# Patient Record
Sex: Male | Born: 1970 | Race: White | Hispanic: No | Marital: Single | Smoking: Current every day smoker
Health system: Southern US, Community
[De-identification: ages and names within clinical notes are randomized; demographics above are authoritative.]

## PROBLEM LIST (undated history)

## (undated) HISTORY — PX: COLON SURGERY: SHX602

## (undated) HISTORY — PX: HERNIA REPAIR: SHX51

## (undated) HISTORY — PX: ABDOMINAL SURGERY: SHX537

---

## 2001-07-30 ENCOUNTER — Encounter: Payer: Self-pay | Admitting: *Deleted

## 2001-07-30 ENCOUNTER — Emergency Department (HOSPITAL_COMMUNITY): Admission: EM | Admit: 2001-07-30 | Discharge: 2001-07-30 | Payer: Self-pay | Admitting: *Deleted

## 2004-09-07 ENCOUNTER — Inpatient Hospital Stay (HOSPITAL_COMMUNITY): Admission: EM | Admit: 2004-09-07 | Discharge: 2004-09-13 | Payer: Self-pay | Admitting: Psychiatry

## 2004-09-07 ENCOUNTER — Ambulatory Visit: Payer: Self-pay | Admitting: Psychiatry

## 2004-09-07 ENCOUNTER — Emergency Department (HOSPITAL_COMMUNITY): Admission: EM | Admit: 2004-09-07 | Discharge: 2004-09-07 | Payer: Self-pay | Admitting: Emergency Medicine

## 2010-11-26 ENCOUNTER — Emergency Department (HOSPITAL_COMMUNITY)
Admission: EM | Admit: 2010-11-26 | Discharge: 2010-11-26 | Disposition: A | Discharge status: Home / Self Care | Payer: Self-pay | Attending: Emergency Medicine | Admitting: Emergency Medicine

## 2010-11-26 DIAGNOSIS — H60399 Other infective otitis externa, unspecified ear: Secondary | ICD-10-CM | POA: Insufficient documentation

## 2012-01-21 ENCOUNTER — Encounter (HOSPITAL_COMMUNITY): Payer: Self-pay | Admitting: *Deleted

## 2012-01-21 ENCOUNTER — Emergency Department (HOSPITAL_COMMUNITY)
Admission: EM | Admit: 2012-01-21 | Discharge: 2012-01-21 | Disposition: A | Discharge status: Home / Self Care | Payer: Self-pay | Attending: Emergency Medicine | Admitting: Emergency Medicine

## 2012-01-21 DIAGNOSIS — L0291 Cutaneous abscess, unspecified: Secondary | ICD-10-CM

## 2012-01-21 DIAGNOSIS — F17200 Nicotine dependence, unspecified, uncomplicated: Secondary | ICD-10-CM | POA: Insufficient documentation

## 2012-01-21 DIAGNOSIS — L0211 Cutaneous abscess of neck: Secondary | ICD-10-CM | POA: Insufficient documentation

## 2012-01-21 MED ORDER — HYDROCODONE-ACETAMINOPHEN 5-325 MG PO TABS: 1.0000 | ORAL_TABLET | ORAL | Status: DC | PRN

## 2012-01-21 MED ORDER — SULFAMETHOXAZOLE-TRIMETHOPRIM 800-160 MG PO TABS: 1.0000 | ORAL_TABLET | Freq: Two times a day (BID) | ORAL | Status: DC

## 2012-01-21 MED ORDER — SULFAMETHOXAZOLE-TRIMETHOPRIM 800-160 MG PO TABS: 1.0000 | ORAL_TABLET | Freq: Two times a day (BID) | ORAL | Status: AC

## 2012-01-21 MED ORDER — LIDOCAINE-EPINEPHRINE 2 %-1:100000 IJ SOLN: 1.7000 mL | Freq: Once | INTRAMUSCULAR | Status: DC

## 2012-01-21 MED ORDER — OXYCODONE-ACETAMINOPHEN 5-325 MG PO TABS: 1.0000 | ORAL_TABLET | ORAL | Status: AC | PRN

## 2012-01-21 MED ORDER — LIDOCAINE-EPINEPHRINE (PF) 2 %-1:200000 IJ SOLN
INTRAMUSCULAR | Status: AC
  Administered 2012-01-21: 20 mL
  Filled 2012-01-21: qty 20

## 2012-01-21 NOTE — ED Notes (Signed)
Pt has swollen reddened area to the right side of his neck for at least a week. Alert and oriented x 3. Skin warm and dry. Color pink. No acute distress. No drainage noted.

## 2012-01-21 NOTE — ED Notes (Signed)
Pt c/o ? Insect bite to the right side of his neck at least a week ago. Swollen and painful with no drainage noted.

## 2012-01-22 NOTE — ED Provider Notes (Signed)
History     CSN: [id number]  Arrival date & time 01/21/12  1032   First MD Initiated Contact with Patient 01/21/12 1039      Chief Complaint  Patient presents with  . Wound Check    (Consider location/radiation/quality/duration/timing/severity/associated sxs/prior treatment) HPI Comments: Jacob Galloway presents with a red, swollen, raised abscess on his right neck, present for the past week.  He squeezed it 2 days ago,  With a scant amount of pus obtained,  But none since. The site is tender.  He denies fevers and chills and has no pharyngitis.  Pain radiates to lateral jawline.  The history is provided by the patient.    History reviewed. No pertinent past medical history.  Past Surgical History  Procedure Date  . Abdominal surgery     History reviewed. No pertinent family history.  History  Substance Use Topics  . Smoking status: Current Everyday Smoker -- 1.0 packs/day    Types: Cigarettes  . Smokeless tobacco: Not on file  . Alcohol Use: Yes      Review of Systems  Constitutional: Negative for fever and chills.  HENT: Negative for facial swelling.   Respiratory: Negative for shortness of breath and wheezing.   Skin: Positive for wound.  Neurological: Negative for numbness.    Allergies  Codeine and Phenobarbital  Home Medications   Current Outpatient Rx  Name Route Sig Dispense Refill  . OXYCODONE-ACETAMINOPHEN 5-325 MG PO TABS Oral Take 1 tablet by mouth every 4 (four) hours as needed for pain. 20 tablet 0  . SULFAMETHOXAZOLE-TRIMETHOPRIM 800-160 MG PO TABS Oral Take 1 tablet by mouth 2 (two) times daily. 20 tablet 0    BP 137/91  Pulse 86  Temp 98 F (36.7 C) (Oral)  Resp 16  Ht 6\' 2"  (1.88 m)  Wt 180 lb (81.647 kg)  BMI 23.11 kg/m2  SpO2 99%  Physical Exam  Constitutional: He is oriented to person, place, and time. He appears well-developed and well-nourished.  HENT:  Head: Normocephalic.  Cardiovascular: Normal rate.     Pulmonary/Chest: Effort normal.  Neurological: He is alert and oriented to person, place, and time. No sensory deficit.  Skin: Skin is warm and dry.       1 cm raised nodule, indurated right lateral neck.  Central non draining punctum.  No red streaking or surrounding erythema.  No cervical or submandibular adenopathy.    ED Course  Procedures (including critical care time)  Labs Reviewed - No data to display No results found.   1. Abscess     INCISION AND DRAINAGE Performed by: Burgess Amor Consent: Verbal consent obtained. Risks and benefits: risks, benefits and alternatives were discussed Type: abscess  Body area: right neck  Anesthesia: local infiltration  Local anesthetic: lidocaine 2% with epinephrine  Anesthetic total: 1 ml  Complexity: complex Blunt dissection to break up loculations  Drainage: purulent  Drainage amount: small  Packing material: abscess not large enough for packing  Patient tolerance: Patient tolerated the procedure well with no immediate complications.  2 x 2 dressing.   MDM  Bactrim,  Oxycodone,  Warm soaks.  PRN f/u        Burgess Amor, [place] 01/22/12 2142

## 2012-01-25 NOTE — ED Provider Notes (Signed)
Medical screening examination/treatment/procedure(s) were performed by non-physician practitioner and as supervising physician I was immediately available for consultation/collaboration.   Cristobal Advani W Diane Hanel, MD 01/25/12 0855 

## 2012-04-01 ENCOUNTER — Emergency Department (HOSPITAL_COMMUNITY)
Admission: EM | Admit: 2012-04-01 | Discharge: 2012-04-01 | Disposition: A | Discharge status: Home / Self Care | Payer: Self-pay | Attending: Emergency Medicine | Admitting: Emergency Medicine

## 2012-04-01 ENCOUNTER — Emergency Department (HOSPITAL_COMMUNITY): Payer: Self-pay

## 2012-04-01 ENCOUNTER — Encounter (HOSPITAL_COMMUNITY): Payer: Self-pay | Admitting: *Deleted

## 2012-04-01 DIAGNOSIS — M7989 Other specified soft tissue disorders: Secondary | ICD-10-CM | POA: Insufficient documentation

## 2012-04-01 DIAGNOSIS — S63509A Unspecified sprain of unspecified wrist, initial encounter: Secondary | ICD-10-CM | POA: Insufficient documentation

## 2012-04-01 DIAGNOSIS — M25539 Pain in unspecified wrist: Secondary | ICD-10-CM | POA: Insufficient documentation

## 2012-04-01 DIAGNOSIS — R296 Repeated falls: Secondary | ICD-10-CM | POA: Insufficient documentation

## 2012-04-01 DIAGNOSIS — F17200 Nicotine dependence, unspecified, uncomplicated: Secondary | ICD-10-CM | POA: Insufficient documentation

## 2012-04-01 MED ORDER — OXYCODONE-ACETAMINOPHEN 5-325 MG PO TABS
1.0000 | ORAL_TABLET | Freq: Once | ORAL | Status: AC
  Administered 2012-04-01: 1 via ORAL
  Filled 2012-04-01: qty 1

## 2012-04-01 MED ORDER — OXYCODONE-ACETAMINOPHEN 5-325 MG PO TABS: 1.0000 | ORAL_TABLET | ORAL | Status: AC | PRN

## 2012-04-01 MED ORDER — NAPROXEN 500 MG PO TABS: 500.0000 mg | ORAL_TABLET | Freq: Two times a day (BID) | ORAL | Status: DC

## 2012-04-01 NOTE — ED Notes (Signed)
Pt c/o fall landing on right wrist three days ago, pain to right wrist area, cms intact distal

## 2012-04-01 NOTE — ED Provider Notes (Signed)
History     CSN: [id number]  Arrival date & time 04/01/12  1305   First MD Initiated Contact with Patient 04/01/12 1337      Chief Complaint  Patient presents with  . Wrist Pain    (Consider location/radiation/quality/duration/timing/severity/associated sxs/prior treatment) HPI Comments: Patient complains of right wrist pain and swelling for 3 days. He states pain began after falling on an outstretched hand. Pain is worse with movement, improves with rest.  He denies pain to the right elbow or shoulder.  Patient is a 41 y.o. male presenting with wrist pain. The history is provided by the patient.  Wrist Pain This is a new problem. The current episode started in the past 7 days. The problem occurs constantly. The problem has been unchanged. Associated symptoms include arthralgias and joint swelling. Pertinent negatives include no chest pain, chills, congestion, coughing, fever, headaches, myalgias, nausea, neck pain, numbness, rash, visual change, vomiting or weakness. The symptoms are aggravated by bending (Movement and palpation). He has tried NSAIDs for the symptoms. The treatment provided mild relief.    History reviewed. No pertinent past medical history.  Past Surgical History  Procedure Date  . Abdominal surgery     No family history on file.  History  Substance Use Topics  . Smoking status: Current Every Day Smoker -- 1.0 packs/day    Types: Cigarettes  . Smokeless tobacco: Not on file  . Alcohol Use: Yes      Review of Systems  Constitutional: Negative for fever and chills.  HENT: Negative for congestion and neck pain.   Respiratory: Negative for cough.   Cardiovascular: Negative for chest pain.  Gastrointestinal: Negative for nausea and vomiting.  Genitourinary: Negative for dysuria and difficulty urinating.  Musculoskeletal: Positive for joint swelling and arthralgias. Negative for myalgias and back pain.  Skin: Negative for color change, rash and wound.    Neurological: Negative for weakness, numbness and headaches.  All other systems reviewed and are negative.    Allergies  Codeine and Phenobarbital  Home Medications   Current Outpatient Rx  Name Route Sig Dispense Refill  . IBUPROFEN 600 MG PO TABS Oral Take 600 mg by mouth every 6 (six) hours as needed. Pain.      BP 144/87  Pulse 74  Temp 98.1 F (36.7 C) (Oral)  Resp 20  Ht 6\' 2"  (1.88 m)  Wt 180 lb (81.647 kg)  BMI 23.11 kg/m2  SpO2 100%  Physical Exam  Nursing note and vitals reviewed. Constitutional: He is oriented to person, place, and time. He appears well-developed and well-nourished. No distress.  HENT:  Head: Normocephalic and atraumatic.  Cardiovascular: Normal rate, regular rhythm and normal heart sounds.   Pulmonary/Chest: Effort normal and breath sounds normal.  Musculoskeletal: He exhibits edema and tenderness.       Right wrist: He exhibits decreased range of motion, tenderness, bony tenderness and swelling. He exhibits no effusion, no crepitus, no deformity and no laceration.       Right wrist is ttp.  Mild to moderate soft tissue swelling is present.  Radial pulse is brisk, distal sensation intact.  CR< 2 sec.  No bruising or deformity.    Neurological: He is alert and oriented to person, place, and time. He exhibits normal muscle tone. Coordination normal.  Skin: Skin is warm and dry.    ED Course  Procedures (including critical care time)  Labs Reviewed - No data to display Dg Wrist Complete Right  04/01/2012  *RADIOLOGY REPORT*  Clinical Data: Wrist pain, trauma 1 month ago and today  RIGHT WRIST - COMPLETE 3+ VIEW  Comparison: None.  Findings: No fracture or dislocation.  No soft tissue abnormality. No radiopaque foreign body.  IMPRESSION: Normal exam.   Original Report Authenticated By: Harrel Lemon, M.D.       velcro splint applied, pain improved, remains NV intact  MDM   Consulted Dr. Hilda Lias. Will see pt in his office.   Recommended velcro wrist splint.    Patient agrees to elevate and apply ice to his wrist.    Prescribed: Naproxen Percocet #20   Alexzia Kasler L. [place], [place] 04/02/12 1824

## 2012-04-03 NOTE — ED Provider Notes (Signed)
Medical screening examination/treatment/procedure(s) were performed by non-physician practitioner and as supervising physician I was immediately available for consultation/collaboration.   Gwyneth Sprout, MD 04/03/12 [phone]

## 2012-09-08 ENCOUNTER — Emergency Department (HOSPITAL_COMMUNITY): Payer: Self-pay

## 2012-09-08 ENCOUNTER — Encounter (HOSPITAL_COMMUNITY): Payer: Self-pay | Admitting: *Deleted

## 2012-09-08 ENCOUNTER — Emergency Department (HOSPITAL_COMMUNITY)
Admission: EM | Admit: 2012-09-08 | Discharge: 2012-09-08 | Disposition: A | Discharge status: Home / Self Care | Payer: Self-pay | Attending: Emergency Medicine | Admitting: Emergency Medicine

## 2012-09-08 DIAGNOSIS — S99919A Unspecified injury of unspecified ankle, initial encounter: Secondary | ICD-10-CM | POA: Insufficient documentation

## 2012-09-08 DIAGNOSIS — M25561 Pain in right knee: Secondary | ICD-10-CM

## 2012-09-08 DIAGNOSIS — Y9289 Other specified places as the place of occurrence of the external cause: Secondary | ICD-10-CM | POA: Insufficient documentation

## 2012-09-08 DIAGNOSIS — W108XXA Fall (on) (from) other stairs and steps, initial encounter: Secondary | ICD-10-CM | POA: Insufficient documentation

## 2012-09-08 DIAGNOSIS — S8990XA Unspecified injury of unspecified lower leg, initial encounter: Secondary | ICD-10-CM | POA: Insufficient documentation

## 2012-09-08 DIAGNOSIS — Y9389 Activity, other specified: Secondary | ICD-10-CM | POA: Insufficient documentation

## 2012-09-08 DIAGNOSIS — F17200 Nicotine dependence, unspecified, uncomplicated: Secondary | ICD-10-CM | POA: Insufficient documentation

## 2012-09-08 MED ORDER — OXYCODONE-ACETAMINOPHEN 5-325 MG PO TABS: 1.0000 | ORAL_TABLET | ORAL | Status: DC | PRN

## 2012-09-08 MED ORDER — NAPROXEN 500 MG PO TABS: 500.0000 mg | ORAL_TABLET | Freq: Two times a day (BID) | ORAL | Status: DC

## 2012-09-08 MED ORDER — OXYCODONE-ACETAMINOPHEN 5-325 MG PO TABS
2.0000 | ORAL_TABLET | Freq: Once | ORAL | Status: AC
  Administered 2012-09-08: 2 via ORAL
  Filled 2012-09-08: qty 2

## 2012-09-08 NOTE — ED Provider Notes (Signed)
History     CSN: [id number]  Arrival date & time 09/08/12  1143   First MD Initiated Contact with Patient 09/08/12 1336      Chief Complaint  Patient presents with  . Knee Pain    (Consider location/radiation/quality/duration/timing/severity/associated sxs/prior treatment) Patient is a 42 y.o. male presenting with knee pain. The history is provided by the patient.  Knee Pain Location:  Knee Time since incident:  1 week Injury: yes   Mechanism of injury: fall   Fall:    Fall occurred:  Down stairs   Height of fall:  From a standing position   Impact surface:  Concrete   Point of impact:  Knees   Entrapped after fall: no   Knee location:  R knee Pain details:    Quality:  Aching   Radiates to:  Does not radiate   Severity:  Severe   Onset quality:  Sudden   Timing:  Constant   Progression:  Worsening Chronicity:  Recurrent Dislocation: no   Foreign body present:  No foreign bodies Relieved by:  Nothing Worsened by:  Bearing weight and flexion Ineffective treatments:  NSAIDs Associated symptoms: decreased ROM and swelling   Associated symptoms: no back pain, no fever, no neck pain, no numbness, no stiffness and no tingling     History reviewed. No pertinent past medical history.  Past Surgical History  Procedure Laterality Date  . Abdominal surgery      No family history on file.  History  Substance Use Topics  . Smoking status: Current Every Day Smoker -- 1.00 packs/day    Types: Cigarettes  . Smokeless tobacco: Not on file  . Alcohol Use: Yes      Review of Systems  Constitutional: Negative for fever and chills.  HENT: Negative for neck pain.   Genitourinary: Negative for dysuria and difficulty urinating.  Musculoskeletal: Positive for joint swelling and arthralgias. Negative for back pain and stiffness.  Skin: Negative for color change and wound.  Neurological: Negative for weakness, numbness and headaches.  All other systems reviewed and are  negative.    Allergies  Codeine and Phenobarbital  Home Medications  No current outpatient prescriptions on file.  BP 128/90  Pulse 98  Temp(Src) 98 F (36.7 C)  Resp 18  Ht 6\' 2"  (1.88 m)  Wt 170 lb (77.111 kg)  BMI 21.82 kg/m2  SpO2 99%  Physical Exam  Nursing note and vitals reviewed. Constitutional: He is oriented to person, place, and time. He appears well-developed and well-nourished. No distress.  Cardiovascular: Normal rate, regular rhythm, normal heart sounds and intact distal pulses.   Pulmonary/Chest: Effort normal and breath sounds normal.  Musculoskeletal: He exhibits edema and tenderness.  ttp of the right anterior knee.  No erythema, bruising or step-off deformity.  DP pulse is brisk, distal sensation intact  Neurological: He is alert and oriented to person, place, and time. He exhibits normal muscle tone. Coordination normal.  Skin: Skin is warm and dry. No erythema.    ED Course  Procedures (including critical care time)  Labs Reviewed - No data to display Dg Knee Complete 4 Views Right  09/08/2012  *RADIOLOGY REPORT*  Clinical Data: Right knee pain  RIGHT KNEE - COMPLETE 4+ VIEW  Comparison: None.  Findings:   No fracture or dislocation.  Small suprapatellar joint effusion is present.  IMPRESSION: No fracture.  Small effusion.   Original Report Authenticated By: Genevive Bi, M.D.      Knee immobilizer applied crutches  given, pain improved, remains neurovascularly intact.  MDM      2:08 PM consulted Dr. Hilda Lias, will see in his office tomorrow for f/u.    Patient agrees to our RICE therapy and orthopedic followup.  Prescribed: Naproxen Percocet #20   The patient appears reasonably screened and/or stabilized for discharge and I doubt any other medical condition or other [facility] requiring further screening, evaluation, or treatment in the ED at this time prior to discharge.     Malachi Suderman L. Daesia Zylka, PA-C 09/10/12 1700

## 2012-09-08 NOTE — ED Notes (Signed)
Tammy PA at bedside,  

## 2012-09-08 NOTE — ED Notes (Signed)
Pt states that he fell down stairs last week and hit right knee on center blocks. Had previous injury to same knee a month ago.

## 2012-09-11 NOTE — ED Provider Notes (Signed)
History/physical exam/procedure(s) were performed by non-physician practitioner and as supervising physician I was immediately available for consultation/collaboration. I have reviewed all notes and am in agreement with care and plan.   Moon Budde S Hermes Wafer, MD 09/11/12 1100 

## 2016-07-02 ENCOUNTER — Emergency Department (HOSPITAL_COMMUNITY): Payer: Self-pay

## 2016-07-02 ENCOUNTER — Emergency Department (HOSPITAL_COMMUNITY)
Admission: EM | Admit: 2016-07-02 | Discharge: 2016-07-02 | Disposition: A | Discharge status: Home / Self Care | Payer: Self-pay | Attending: Emergency Medicine | Admitting: Emergency Medicine

## 2016-07-02 ENCOUNTER — Encounter (HOSPITAL_COMMUNITY): Payer: Self-pay | Admitting: Emergency Medicine

## 2016-07-02 DIAGNOSIS — S63501A Unspecified sprain of right wrist, initial encounter: Secondary | ICD-10-CM | POA: Insufficient documentation

## 2016-07-02 DIAGNOSIS — Y999 Unspecified external cause status: Secondary | ICD-10-CM | POA: Insufficient documentation

## 2016-07-02 DIAGNOSIS — Y9351 Activity, roller skating (inline) and skateboarding: Secondary | ICD-10-CM | POA: Insufficient documentation

## 2016-07-02 DIAGNOSIS — F17210 Nicotine dependence, cigarettes, uncomplicated: Secondary | ICD-10-CM | POA: Insufficient documentation

## 2016-07-02 DIAGNOSIS — Y92331 Roller skating rink as the place of occurrence of the external cause: Secondary | ICD-10-CM | POA: Insufficient documentation

## 2016-07-02 MED ORDER — HYDROCODONE-ACETAMINOPHEN 5-325 MG PO TABS: 1.0000 | ORAL_TABLET | ORAL | 0 refills | Status: DC | PRN

## 2016-07-02 MED ORDER — HYDROCODONE-ACETAMINOPHEN 5-325 MG PO TABS
1.0000 | ORAL_TABLET | Freq: Once | ORAL | Status: AC
  Administered 2016-07-02: 1 via ORAL
  Filled 2016-07-02: qty 1

## 2016-07-02 MED ORDER — IBUPROFEN 600 MG PO TABS: 600.0000 mg | ORAL_TABLET | Freq: Four times a day (QID) | ORAL | 0 refills | Status: DC | PRN

## 2016-07-02 NOTE — ED Provider Notes (Signed)
[facility]-EMERGENCY DEPT Provider Note   CSN: [id number] Arrival date & time: 07/02/16  0023     History   Chief Complaint Chief Complaint  Patient presents with  . 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  He originally felt he was just bruised, so went home, but has been unable to sleep secondary to throbbing pain.  He denies radiation of pain. It is worsened with palpation and range of motion.  He has had no treatment prior arrival.  The history is provided by the patient.    History reviewed. No pertinent past medical history.  There are no active problems to display for this patient.   Past Surgical History:  Procedure Laterality Date  . ABDOMINAL SURGERY    . COLON SURGERY    . HERNIA REPAIR         Home Medications    Prior to Admission medications   Medication Sig Start Date End Date Taking? Authorizing Provider  HYDROcodone-acetaminophen (NORCO/VICODIN) 5-325 MG tablet Take 1 tablet by mouth every 4 (four) hours as needed. 07/02/16   Burgess Amor, PA-C  ibuprofen (ADVIL,MOTRIN) 600 MG tablet Take 1 tablet (600 mg total) by mouth every 6 (six) hours as needed. 07/02/16   Burgess Amor, PA-C  naproxen (NAPROSYN) 500 MG tablet Take 1 tablet (500 mg total) by mouth 2 (two) times daily with a meal. 09/08/12   Tammy Triplett, PA-C  oxyCODONE-acetaminophen (PERCOCET/ROXICET) 5-325 MG per tablet Take 1 tablet by mouth every 4 (four) hours as needed for pain. 09/08/12   Tammy Triplett, PA-C    Family History No family history on file.  Social History Social History  Substance Use Topics  . Smoking status: Current Every Day Smoker    Packs/day: 1.00    Types: Cigarettes  . Smokeless tobacco: Never Used  . Alcohol use Yes     Allergies   Codeine and Phenobarbital   Review of Systems Review of  Systems  Constitutional: Negative for fever.  Musculoskeletal: Positive for arthralgias and joint swelling. Negative for myalgias.  Neurological: Negative for weakness and numbness.     Physical Exam Updated Vital Signs BP 111/76 (BP Location: Right Arm)   Pulse 75   Temp 97.7 F (36.5 C) (Oral)   Resp 18   Ht 6\' 2"  (1.88 m)   Wt 77.1 kg   SpO2 97%   BMI 21.83 kg/m   Physical Exam  Constitutional: He appears well-developed and well-nourished.  HENT:  Head: Atraumatic.  Neck: Normal range of motion.  Cardiovascular:  Pulses:      Radial pulses are 2+ on the right side, and 2+ on the left side.  Pulses equal bilaterally  Musculoskeletal: He exhibits tenderness.       Right wrist: He exhibits decreased range of motion, tenderness and swelling. He exhibits no deformity.       Left wrist: Normal. He exhibits no crepitus and no deformity.  Neurological: He is alert. He has normal strength. He displays normal reflexes. No sensory deficit.  Normal sensation in hand and fingers.  Skin: Skin is warm and dry.  Psychiatric: He has a normal mood and affect.     ED Treatments / Results  Labs (all labs ordered are listed, but only abnormal results are displayed) Labs Reviewed - No data to display  EKG  EKG Interpretation None       Radiology Dg Wrist Complete Right  Result Date: 07/02/2016 CLINICAL DATA:  Right wrist pain after fall while skating. Pain posterior right wrist with diffuse swelling. EXAM: RIGHT WRIST - COMPLETE 3+ VIEW COMPARISON:  04/01/2012 FINDINGS: Dorsal soft tissue swelling over the right wrist. No evidence of acute fracture or dislocation. No focal bone lesion or bone destruction. Bone cortex appears intact. IMPRESSION: Dorsal soft tissue swelling. No acute bony abnormalities identified. Electronically Signed   By: Burman NievesWilliam  Stevens M.D.   On: 07/02/2016 01:29    Procedures Procedures (including critical care time)  Medications Ordered in  ED Medications  HYDROcodone-acetaminophen (NORCO/VICODIN) 5-325 MG per tablet 1 tablet (1 tablet Oral Given 07/02/16 0110)     Initial Impression / Assessment and Plan / ED Course  I have reviewed the triage vital signs and the nursing notes.  Pertinent labs & imaging results that were available during my care of the patient were reviewed by me and considered in my medical decision making (see chart for details).  Clinical Course     Suspected sprain. velcro splint, RICE,  Ibuprofen.  Prn f/u with ortho if not improving over the next 1-2 weeks.  Final Clinical Impressions(s) / ED Diagnoses   Final diagnoses:  Sprain of right wrist, initial encounter    New Prescriptions New Prescriptions   HYDROCODONE-ACETAMINOPHEN (NORCO/VICODIN) 5-325 MG TABLET    Take 1 tablet by mouth every 4 (four) hours as needed.   IBUPROFEN (ADVIL,MOTRIN) 600 MG TABLET    Take 1 tablet (600 mg total) by mouth every 6 (six) hours as needed.     Burgess AmorJulie Derra Shartzer, PA-C 07/02/16 0140    Devoria AlbeIva Knapp, MD 07/02/16 [phone]

## 2016-07-02 NOTE — ED Triage Notes (Signed)
Pt here with c/o R wrist pain [place].

## 2016-07-02 NOTE — Discharge Instructions (Signed)
Use ice and elevation as much as possible for the next several days to help reduce the swelling.  You may take the hydrocodone prescribed for pain relief.  This will make you drowsy - do not drive within 4 hours of taking this medication.  Use the ibuprofen also for inflammation.  Call the orthopedic doctor listed for a recheck of your injury if your pain does not improve over the next 1-2 weeks.  Your xrays are normal today.

## 2018-11-26 ENCOUNTER — Emergency Department (HOSPITAL_COMMUNITY): Payer: Self-pay

## 2018-11-26 ENCOUNTER — Other Ambulatory Visit: Payer: Self-pay

## 2018-11-26 ENCOUNTER — Emergency Department (HOSPITAL_COMMUNITY)
Admission: EM | Admit: 2018-11-26 | Discharge: 2018-11-26 | Disposition: A | Discharge status: Home / Self Care | Payer: Self-pay | Attending: Emergency Medicine | Admitting: Emergency Medicine

## 2018-11-26 ENCOUNTER — Encounter (HOSPITAL_COMMUNITY): Payer: Self-pay

## 2018-11-26 DIAGNOSIS — Z79899 Other long term (current) drug therapy: Secondary | ICD-10-CM | POA: Insufficient documentation

## 2018-11-26 DIAGNOSIS — F17210 Nicotine dependence, cigarettes, uncomplicated: Secondary | ICD-10-CM | POA: Insufficient documentation

## 2018-11-26 DIAGNOSIS — M25561 Pain in right knee: Secondary | ICD-10-CM

## 2018-11-26 MED ORDER — HYDROCODONE-ACETAMINOPHEN 5-325 MG PO TABS
1.0000 | ORAL_TABLET | Freq: Once | ORAL | Status: AC
  Administered 2018-11-26: 1 via ORAL
  Filled 2018-11-26: qty 1

## 2018-11-26 NOTE — ED Provider Notes (Signed)
[facility] EMERGENCY DEPARTMENT Provider Note   CSN: [id number] Arrival date & time: 11/26/18  1129    History   Chief Complaint Chief Complaint  Patient presents with  . Knee Pain    HPI Jacob Galloway is a 48 y.o. male presents for evaluation of acute onset constant right knee pain secondary to injury to yesterday.  He reports that he was cutting a tree branch while in a tree when the limb flew back striking him along the lateral aspect of the right knee.  He notes a constant sharp pain to the lateral aspect of the right knee as well as some swelling, pain worsens with attempts to ambulate and with palpation.  The pain does not radiate.  He denies numbness, weakness, fevers, or chills.  He has been able to bear weight on the extremity since the injury.  He has been applying ice with little relief of his symptoms.  Denies fall, head injury, bleeding, or wound.     The history is provided by the patient.    History reviewed. No pertinent past medical history.  There are no active problems to display for this patient.   Past Surgical History:  Procedure Laterality Date  . ABDOMINAL SURGERY    . COLON SURGERY    . HERNIA REPAIR          Home Medications    Prior to Admission medications   Medication Sig Start Date End Date Taking? Authorizing Provider  HYDROcodone-acetaminophen (NORCO/VICODIN) 5-325 MG tablet Take 1 tablet by mouth every 4 (four) hours as needed. 07/02/16   Burgess AmorIdol, Julie, PA-C  ibuprofen (ADVIL,MOTRIN) 600 MG tablet Take 1 tablet (600 mg total) by mouth every 6 (six) hours as needed. 07/02/16   Burgess AmorIdol, Julie, PA-C  naproxen (NAPROSYN) 500 MG tablet Take 1 tablet (500 mg total) by mouth 2 (two) times daily with a meal. 09/08/12   Triplett, Tammy, PA-C  oxyCODONE-acetaminophen (PERCOCET/ROXICET) 5-325 MG per tablet Take 1 tablet by mouth every 4 (four) hours as needed for pain. 09/08/12   Pauline Ausriplett, Tammy, PA-C    Family History No family history on file.   Social History Social History   Tobacco Use  . Smoking status: Current Every Day Smoker    Packs/day: 1.00    Types: Cigarettes  . Smokeless tobacco: Never Used  Substance Use Topics  . Alcohol use: Yes  . Drug use: Yes    Types: Marijuana     Allergies   Codeine and Phenobarbital   Review of Systems Review of Systems  Constitutional: Negative for fever.  Musculoskeletal: Positive for arthralgias and joint swelling.  Neurological: Negative for weakness and numbness.     Physical Exam Updated Vital Signs BP 137/87 (BP Location: Left Arm)   Pulse 70   Temp 97.7 F (36.5 C) (Oral)   Resp 16   Ht 6\' 2"  (1.88 m)   Wt 74.8 kg   SpO2 98%   BMI 21.18 kg/m   Physical Exam Vitals signs and nursing note reviewed.  Constitutional:      General: He is not in acute distress.    Appearance: He is well-developed.  HENT:     Head: Normocephalic and atraumatic.  Eyes:     General:        Right eye: No discharge.        Left eye: No discharge.     Conjunctiva/sclera: Conjunctivae normal.  Neck:     Vascular: No JVD.     Trachea:  No tracheal deviation.  Cardiovascular:     Rate and Rhythm: Normal rate.     Pulses: Normal pulses.     Comments: 2+ DP/PT pulses bilaterally Pulmonary:     Effort: Pulmonary effort is normal.  Abdominal:     General: There is no distension.  Musculoskeletal:        General: Swelling and tenderness present.     Comments: Swelling to the anterior aspect of the right knee, maximally tender to palpation along the MCL and medial to the patella.  Limited range of motion with flexion secondary to pain but negative anterior/posterior drawer test, no varus or valgus instability.  He is able to extend the right knee against gravity.  No quadriceps tendon deformity.  5/5 strength of BLE major muscle groups.  Skin:    General: Skin is warm and dry.     Findings: No erythema.  Neurological:     Mental Status: He is alert.     Comments: Sensation  intact to soft touch of bilateral lower extremities.  Patient ambulates with an antalgic gait but is able to heel walk and toe walk without difficulty.  Psychiatric:        Behavior: Behavior normal.      ED Treatments / Results  Labs (all labs ordered are listed, but only abnormal results are displayed) Labs Reviewed - No data to display  EKG None  Radiology Dg Knee Complete 4 Views Right  Result Date: 11/26/2018 CLINICAL DATA:  Right knee pain and swelling, trauma EXAM: RIGHT KNEE - COMPLETE 4+ VIEW COMPARISON:  09/08/2012 FINDINGS: No fracture or dislocation of the right knee. The joint spaces are well preserved. No significant knee joint effusion. There is soft tissue edema of the anterior knee. IMPRESSION: No fracture or dislocation of the right knee. The joint spaces are well preserved. No significant knee joint effusion. There is soft tissue edema of the anterior knee. Electronically Signed   By: Eddie Candle M.D.   On: 11/26/2018 12:52    Procedures Procedures (including critical care time)  Medications Ordered in ED Medications  HYDROcodone-acetaminophen (NORCO/VICODIN) 5-325 MG per tablet 1 tablet (has no administration in time range)     Initial Impression / Assessment and Plan / ED Course  I have reviewed the triage vital signs and the nursing notes.  Pertinent labs & imaging results that were available during my care of the patient were reviewed by me and considered in my medical decision making (see chart for details).        Patient with right knee pain and swelling after injury yesterday.  He is afebrile, vital signs are stable.  He is nontoxic in appearance.  He is neurovascularly intact.  Patient X-Ray negative for obvious fracture or dislocation but there is soft tissue swelling to the anterior aspect of the knee noted.  No concern for DVT, septic arthritis, osteomyelitis.  Pain managed in ED. Pt advised to follow up with orthopedics if symptoms persist for  possibility of missed fracture diagnosis. Patient given knee sleeve while in ED, conservative therapy recommended and discussed.  Discussed strict ED return precautions. Pt verbalized understanding of and agreement with plan and is safe for discharge home at this time.   Final Clinical Impressions(s) / ED Diagnoses   Final diagnoses:  Acute pain of right knee    ED Discharge Orders    None       Renita Papa, PA-C 11/26/18 [place], [place], DO 11/29/18  1657  

## 2018-11-26 NOTE — ED Triage Notes (Signed)
Pt presents to ED with complaints of R Knee pain. Pt states he was cutting trees yesterday and the limb came down and hit him in the knee.

## 2018-11-26 NOTE — Discharge Instructions (Addendum)
1. Medications: You can alternate 600 mg of ibuprofen and [phone] mg of Tylenol every 3 hours as needed for pain. Do not exceed 4000 mg of Tylenol daily.  Take ibuprofen with food to avoid upset stomach issues.  2. Treatment: rest, ice, elevate and use brace/knee sleeve, drink plenty of fluids, gentle stretching 3. Follow Up: Please followup with orthopedics as directed or your PCP in 1 week if no improvement for discussion of your diagnoses and further evaluation after today's visit; if you do not have a primary care doctor use the resource guide provided to find one; Please return to the ER for worsening symptoms or other concerns such as worsening swelling, redness of the skin, fevers, loss of pulses, or loss of feeling

## 2019-03-10 ENCOUNTER — Encounter (HOSPITAL_COMMUNITY)
Admission: EM | Disposition: A | Discharge status: Home / Self Care | Payer: Self-pay | Source: Home / Self Care | Attending: Emergency Medicine

## 2019-03-10 ENCOUNTER — Emergency Department (HOSPITAL_COMMUNITY): Payer: Self-pay | Admitting: Certified Registered Nurse Anesthetist

## 2019-03-10 ENCOUNTER — Encounter (HOSPITAL_COMMUNITY): Payer: Self-pay

## 2019-03-10 ENCOUNTER — Emergency Department (HOSPITAL_COMMUNITY)
Admission: EM | Admit: 2019-03-10 | Discharge: 2019-03-10 | Disposition: A | Discharge status: Home / Self Care | Payer: Self-pay | Attending: Emergency Medicine | Admitting: Emergency Medicine

## 2019-03-10 ENCOUNTER — Emergency Department (HOSPITAL_COMMUNITY): Payer: Self-pay

## 2019-03-10 ENCOUNTER — Other Ambulatory Visit: Payer: Self-pay

## 2019-03-10 DIAGNOSIS — S66222A Laceration of extensor muscle, fascia and tendon of left thumb at wrist and hand level, initial encounter: Secondary | ICD-10-CM | POA: Insufficient documentation

## 2019-03-10 DIAGNOSIS — Z791 Long term (current) use of non-steroidal anti-inflammatories (NSAID): Secondary | ICD-10-CM | POA: Insufficient documentation

## 2019-03-10 DIAGNOSIS — W293XXA Contact with powered garden and outdoor hand tools and machinery, initial encounter: Secondary | ICD-10-CM | POA: Insufficient documentation

## 2019-03-10 DIAGNOSIS — Z885 Allergy status to narcotic agent status: Secondary | ICD-10-CM | POA: Insufficient documentation

## 2019-03-10 DIAGNOSIS — S6422XA Injury of radial nerve at wrist and hand level of left arm, initial encounter: Secondary | ICD-10-CM | POA: Insufficient documentation

## 2019-03-10 DIAGNOSIS — F17210 Nicotine dependence, cigarettes, uncomplicated: Secondary | ICD-10-CM | POA: Insufficient documentation

## 2019-03-10 DIAGNOSIS — Z20828 Contact with and (suspected) exposure to other viral communicable diseases: Secondary | ICD-10-CM | POA: Insufficient documentation

## 2019-03-10 DIAGNOSIS — S61012A Laceration without foreign body of left thumb without damage to nail, initial encounter: Secondary | ICD-10-CM

## 2019-03-10 DIAGNOSIS — S66922A Laceration of unspecified muscle, fascia and tendon at wrist and hand level, left hand, initial encounter: Secondary | ICD-10-CM

## 2019-03-10 DIAGNOSIS — Z79899 Other long term (current) drug therapy: Secondary | ICD-10-CM | POA: Insufficient documentation

## 2019-03-10 HISTORY — PX: I & D EXTREMITY: SHX5045

## 2019-03-10 LAB — SARS CORONAVIRUS 2 BY RT PCR (HOSPITAL ORDER, PERFORMED IN ~~LOC~~ HOSPITAL LAB): SARS Coronavirus 2: NEGATIVE

## 2019-03-10 SURGERY — IRRIGATION AND DEBRIDEMENT EXTREMITY
Anesthesia: Monitor Anesthesia Care | Site: Hand | Laterality: Left

## 2019-03-10 MED ORDER — PROPOFOL 10 MG/ML IV BOLUS
INTRAVENOUS | Status: AC
  Filled 2019-03-10: qty 20

## 2019-03-10 MED ORDER — HYDROMORPHONE HCL 1 MG/ML IJ SOLN
1.0000 mg | Freq: Once | INTRAMUSCULAR | Status: AC
  Administered 2019-03-10: 11:00:00 1 mg via INTRAVENOUS
  Filled 2019-03-10: qty 1

## 2019-03-10 MED ORDER — CEFAZOLIN SODIUM-DEXTROSE 2-4 GM/100ML-% IV SOLN
2.0000 g | INTRAVENOUS | Status: AC
  Administered 2019-03-10: 2 g via INTRAVENOUS
  Filled 2019-03-10: qty 100

## 2019-03-10 MED ORDER — MEPERIDINE HCL 50 MG/ML IJ SOLN: 6.2500 mg | INTRAMUSCULAR | Status: DC | PRN

## 2019-03-10 MED ORDER — OXYCODONE HCL 5 MG PO TABS: 5.0000 mg | ORAL_TABLET | Freq: Four times a day (QID) | ORAL | 0 refills | Status: AC | PRN

## 2019-03-10 MED ORDER — MIDAZOLAM HCL 5 MG/5ML IJ SOLN
INTRAMUSCULAR | Status: DC | PRN
  Administered 2019-03-10: 2 mg via INTRAVENOUS

## 2019-03-10 MED ORDER — PROMETHAZINE HCL 25 MG/ML IJ SOLN: 6.2500 mg | INTRAMUSCULAR | Status: DC | PRN

## 2019-03-10 MED ORDER — IBUPROFEN 600 MG PO TABS: 600.0000 mg | ORAL_TABLET | Freq: Four times a day (QID) | ORAL | 0 refills | Status: AC

## 2019-03-10 MED ORDER — LIDOCAINE HCL 2 % IJ SOLN
INTRAMUSCULAR | Status: DC | PRN
  Administered 2019-03-10: 5 mL

## 2019-03-10 MED ORDER — OXYCODONE HCL 5 MG PO TABS
5.0000 mg | ORAL_TABLET | Freq: Once | ORAL | Status: AC | PRN
  Administered 2019-03-10: 17:00:00 5 mg via ORAL

## 2019-03-10 MED ORDER — LIDOCAINE HCL 2 % IJ SOLN
INTRAMUSCULAR | Status: AC
  Filled 2019-03-10: qty 20

## 2019-03-10 MED ORDER — CEFAZOLIN SODIUM-DEXTROSE 1-4 GM/50ML-% IV SOLN
1.0000 g | Freq: Once | INTRAVENOUS | Status: AC
  Administered 2019-03-10: 11:00:00 1 g via INTRAVENOUS
  Filled 2019-03-10: qty 50

## 2019-03-10 MED ORDER — FENTANYL CITRATE (PF) 250 MCG/5ML IJ SOLN
INTRAMUSCULAR | Status: AC
  Filled 2019-03-10: qty 5

## 2019-03-10 MED ORDER — POVIDONE-IODINE 10 % EX SWAB: 2.0000 "application " | Freq: Once | CUTANEOUS | Status: DC

## 2019-03-10 MED ORDER — OXYCODONE HCL 5 MG/5ML PO SOLN: 5.0000 mg | Freq: Once | ORAL | Status: AC | PRN

## 2019-03-10 MED ORDER — KETOROLAC TROMETHAMINE 30 MG/ML IJ SOLN: 30.0000 mg | Freq: Once | INTRAMUSCULAR | Status: DC | PRN

## 2019-03-10 MED ORDER — ACETAMINOPHEN 325 MG PO TABS: 650.0000 mg | ORAL_TABLET | Freq: Four times a day (QID) | ORAL | Status: AC

## 2019-03-10 MED ORDER — PHENYLEPHRINE 40 MCG/ML (10ML) SYRINGE FOR IV PUSH (FOR BLOOD PRESSURE SUPPORT)
PREFILLED_SYRINGE | INTRAVENOUS | Status: DC | PRN
  Administered 2019-03-10 (×2): 80 ug via INTRAVENOUS
  Administered 2019-03-10 (×2): 40 ug via INTRAVENOUS
  Administered 2019-03-10: 80 ug via INTRAVENOUS

## 2019-03-10 MED ORDER — ONDANSETRON HCL 4 MG/2ML IJ SOLN
4.0000 mg | Freq: Four times a day (QID) | INTRAMUSCULAR | Status: DC | PRN
  Administered 2019-03-10: 4 mg via INTRAVENOUS
  Filled 2019-03-10: qty 2

## 2019-03-10 MED ORDER — MIDAZOLAM HCL 2 MG/2ML IJ SOLN
INTRAMUSCULAR | Status: AC
  Filled 2019-03-10: qty 2

## 2019-03-10 MED ORDER — ONDANSETRON HCL 4 MG/2ML IJ SOLN
INTRAMUSCULAR | Status: AC
  Filled 2019-03-10: qty 2

## 2019-03-10 MED ORDER — KETAMINE HCL 10 MG/ML IJ SOLN
INTRAMUSCULAR | Status: DC | PRN
  Administered 2019-03-10: 10 mg via INTRAVENOUS

## 2019-03-10 MED ORDER — OXYCODONE HCL 5 MG PO TABS
ORAL_TABLET | ORAL | Status: AC
  Administered 2019-03-10: 17:00:00 5 mg via ORAL
  Filled 2019-03-10: qty 1

## 2019-03-10 MED ORDER — KETAMINE HCL 10 MG/ML IJ SOLN
INTRAMUSCULAR | Status: AC
  Filled 2019-03-10: qty 1

## 2019-03-10 MED ORDER — SODIUM CHLORIDE 0.9 % IV BOLUS
1000.0000 mL | Freq: Once | INTRAVENOUS | Status: AC
  Administered 2019-03-10: 11:00:00 1000 mL via INTRAVENOUS

## 2019-03-10 MED ORDER — PROPOFOL 500 MG/50ML IV EMUL
INTRAVENOUS | Status: DC | PRN
  Administered 2019-03-10: 75 ug/kg/min via INTRAVENOUS

## 2019-03-10 MED ORDER — PROPOFOL 10 MG/ML IV BOLUS
INTRAVENOUS | Status: DC | PRN
  Administered 2019-03-10 (×3): 30 mg via INTRAVENOUS
  Administered 2019-03-10: 20 mg via INTRAVENOUS

## 2019-03-10 MED ORDER — BUPIVACAINE-EPINEPHRINE 0.5% -1:200000 IJ SOLN
INTRAMUSCULAR | Status: DC | PRN
  Administered 2019-03-10: 5 mL

## 2019-03-10 MED ORDER — 0.9 % SODIUM CHLORIDE (POUR BTL) OPTIME
TOPICAL | Status: DC | PRN
  Administered 2019-03-10: 1000 mL

## 2019-03-10 MED ORDER — CHLORHEXIDINE GLUCONATE 4 % EX LIQD: 60.0000 mL | Freq: Once | CUTANEOUS | Status: DC

## 2019-03-10 MED ORDER — BUPIVACAINE-EPINEPHRINE 0.5% -1:200000 IJ SOLN
INTRAMUSCULAR | Status: AC
  Filled 2019-03-10: qty 1

## 2019-03-10 MED ORDER — HYDROMORPHONE HCL 1 MG/ML IJ SOLN: 0.2500 mg | INTRAMUSCULAR | Status: DC | PRN

## 2019-03-10 MED ORDER — FENTANYL CITRATE (PF) 250 MCG/5ML IJ SOLN
INTRAMUSCULAR | Status: DC | PRN
  Administered 2019-03-10: 50 ug via INTRAVENOUS

## 2019-03-10 MED ORDER — LACTATED RINGERS IV SOLN
INTRAVENOUS | Status: DC
  Administered 2019-03-10: 15:00:00 via INTRAVENOUS

## 2019-03-10 SURGICAL SUPPLY — 35 items
BNDG COHESIVE 4X5 TAN STRL (GAUZE/BANDAGES/DRESSINGS) ×2 IMPLANT
BNDG GAUZE ELAST 4 BULKY (GAUZE/BANDAGES/DRESSINGS) ×2 IMPLANT
CORD BIPOLAR FORCEPS 12FT (ELECTRODE) ×2 IMPLANT
COVER SURGICAL LIGHT HANDLE (MISCELLANEOUS) ×2 IMPLANT
CUFF TOURN SGL QUICK 18X4 (TOURNIQUET CUFF) ×2 IMPLANT
DRAPE SURG 17X11 SM STRL (DRAPES) ×2 IMPLANT
DRSG EMULSION OIL 3X3 NADH (GAUZE/BANDAGES/DRESSINGS) ×2 IMPLANT
ELECT REM PT RETURN 15FT ADLT (MISCELLANEOUS) IMPLANT
GAUZE 4X4 16PLY RFD (DISPOSABLE) ×2 IMPLANT
GAUZE SPONGE 4X4 12PLY STRL (GAUZE/BANDAGES/DRESSINGS) ×2 IMPLANT
GLOVE BIO SURGEON STRL SZ 6.5 (GLOVE) ×1 IMPLANT
GLOVE BIO SURGEON STRL SZ7.5 (GLOVE) ×2 IMPLANT
GLOVE BIO SURGEONS STRL SZ 6.5 (GLOVE) ×1
GLOVE BIOGEL PI IND STRL 7.0 (GLOVE) IMPLANT
GLOVE BIOGEL PI INDICATOR 7.0 (GLOVE) ×2
GLOVE INDICATOR 8.0 STRL GRN (GLOVE) ×2 IMPLANT
GOWN STRL REUS W/TWL LRG LVL3 (GOWN DISPOSABLE) ×2 IMPLANT
KIT BASIN OR (CUSTOM PROCEDURE TRAY) ×2 IMPLANT
MANIFOLD NEPTUNE II (INSTRUMENTS) ×2 IMPLANT
NDL HYPO 25X1 1.5 SAFETY (NEEDLE) IMPLANT
NEEDLE HYPO 25X1 1.5 SAFETY (NEEDLE) ×3 IMPLANT
PACK ORTHO EXTREMITY (CUSTOM PROCEDURE TRAY) ×2 IMPLANT
PAD CAST 4YDX4 CTTN HI CHSV (CAST SUPPLIES) IMPLANT
PADDING CAST COTTON 4X4 STRL (CAST SUPPLIES) ×3
SOL PREP POV-IOD 4OZ 10% (MISCELLANEOUS) ×2 IMPLANT
STOCKINETTE 4X48 STRL (DRAPES) IMPLANT
STOCKINETTE 6  STRL (DRAPES)
STOCKINETTE 6 STRL (DRAPES) IMPLANT
SUT ETHILON 8 0 BV130 4 (SUTURE) ×2 IMPLANT
SUT FIBERWIRE 3-0 18 TAPR NDL (SUTURE) ×6
SUT PROLENE 6 0 P 1 18 (SUTURE) ×2 IMPLANT
SUT VICRYL RAPIDE 4/0 PS 2 (SUTURE) ×2 IMPLANT
SUTURE FIBERWR 3-0 18 TAPR NDL (SUTURE) IMPLANT
SYR 10ML LL (SYRINGE) ×2 IMPLANT
TOWEL OR 17X26 10 PK STRL BLUE (TOWEL DISPOSABLE) ×2 IMPLANT

## 2019-03-10 NOTE — Transfer of Care (Signed)
Immediate Anesthesia Transfer of Care Note  Patient: Jacob Galloway  Procedure(s) Performed: IRRIGATION AND DEBRIDEMENT EXTREMITY REPAIR OF SKIN, TWO TENSONS, AND NERVE REPAIR (Left Hand)  Patient Location: PACU  Anesthesia Type:MAC  Level of Consciousness: awake, alert  and oriented  Airway & Oxygen Therapy: Patient Spontanous Breathing and Patient connected to face mask oxygen  Post-op Assessment: Report given to RN and Post -op Vital signs reviewed and stable  Post vital signs: Reviewed and stable  Last Vitals:  Vitals Value Taken Time  BP    Temp    Pulse    Resp    SpO2      Last Pain:  Vitals:   03/10/19 1500  TempSrc: Oral  PainSc:          Complications: No apparent anesthesia complications

## 2019-03-10 NOTE — ED Provider Notes (Signed)
[facility]-EMERGENCY DEPT Provider Note   CSN: [id number] Arrival date & time: 03/10/19  1001     History   Chief Complaint Chief Complaint  Patient presents with  . Extremity Laceration    HPI Jacob Galloway is a 48 y.o. male.     HPI Patient presents after sustaining an injury due to a chainsaw accident. Patient was in a bucket truck, working on a tree limb, when he had the saw kicked back onto his left thumb. No other injuries, but since the event the patient has had sharp severe pain in the left posterior lateral distal hand, worse with motion, pressure, attempted moving the thumb. Patient states that he is generally well, his tetanus status is up-to-date, he denies medical problems, and was in his usual state of health prior to the event. History reviewed. No pertinent past medical history.  There are no active problems to display for this patient.   Past Surgical History:  Procedure Laterality Date  . ABDOMINAL SURGERY    . COLON SURGERY    . HERNIA REPAIR          Home Medications    Prior to Admission medications   Medication Sig Start Date End Date Taking? Authorizing Provider  HYDROcodone-acetaminophen (NORCO/VICODIN) 5-325 MG tablet Take 1 tablet by mouth every 4 (four) hours as needed. Patient not taking: Reported on 03/10/2019 07/02/16   Burgess Amor, PA-C  ibuprofen (ADVIL,MOTRIN) 600 MG tablet Take 1 tablet (600 mg total) by mouth every 6 (six) hours as needed. Patient not taking: Reported on 03/10/2019 07/02/16   Burgess Amor, PA-C  naproxen (NAPROSYN) 500 MG tablet Take 1 tablet (500 mg total) by mouth 2 (two) times daily with a meal. Patient not taking: Reported on 03/10/2019 09/08/12   Triplett, Tammy, PA-C  oxyCODONE-acetaminophen (PERCOCET/ROXICET) 5-325 MG per tablet Take 1 tablet by mouth every 4 (four) hours as needed for pain. Patient not taking: Reported on 03/10/2019 09/08/12   Pauline Aus, PA-C    Family History  History reviewed. No pertinent family history.  Social History Social History   Tobacco Use  . Smoking status: Current Every Day Smoker    Packs/day: 1.00    Types: Cigarettes  . Smokeless tobacco: Never Used  Substance Use Topics  . Alcohol use: Yes  . Drug use: Yes    Types: Marijuana     Allergies   Codeine and Phenobarbital   Review of Systems Review of Systems  Constitutional:       Per HPI, otherwise negative  HENT:       Per HPI, otherwise negative  Respiratory:       Per HPI, otherwise negative  Cardiovascular:       Per HPI, otherwise negative  Gastrointestinal: Negative for vomiting.  Endocrine:       Negative aside from HPI  Genitourinary:       Neg aside from HPI   Musculoskeletal:       Per HPI, otherwise negative  Skin: Positive for wound.  Neurological: Positive for numbness. Negative for syncope.  Hematological: Negative.      Physical Exam Updated Vital Signs BP 126/82   Pulse 72   Temp 98.4 F (36.9 C) (Oral)   Resp 16   Ht 6\' 2"  (1.88 m)   Wt 63.5 kg   SpO2 99%   BMI 17.97 kg/m   Physical Exam Vitals signs and nursing note reviewed.  Constitutional:      General: He is not  in acute distress.    Appearance: He is well-developed.  HENT:     Head: Normocephalic and atraumatic.  Eyes:     Conjunctiva/sclera: Conjunctivae normal.  Cardiovascular:     Rate and Rhythm: Normal rate and regular rhythm.  Pulmonary:     Effort: Pulmonary effort is normal. No respiratory distress.     Breath sounds: No stridor.  Abdominal:     General: There is no distension.  Musculoskeletal:     Right wrist: Normal.     Left wrist: Normal.     Comments: There is an open wound on the dorsal aspect of the thumb just proximal to the MCP line. Patient has preserved flexion, distal sensation on the volar surface, but is unable to extend at the thumb either proximal or distal.  Patient has grossly preserved ability to abduct and less so adduct the  thumb. No other gross injuries.  Skin:    General: Skin is warm and dry.  Neurological:     Mental Status: He is alert and oriented to person, place, and time.      ED Treatments / Results  Labs (all labs ordered are listed, but only abnormal results are displayed) Labs Reviewed  SARS CORONAVIRUS 2 ([facility])    Radiology Dg Hand 2 View Left  Result Date: 03/10/2019 CLINICAL DATA:  Chainsaw injury. EXAM: LEFT HAND - 2 VIEW COMPARISON:  No recent. FINDINGS: Patient is in a bandage and the left thumb is flexed make evaluation difficult. No acute bony or joint abnormality identified. Tiny sclerotic density noted the distal left radius most likely tiny bone island. No radiopaque foreign body. IMPRESSION: No acute abnormality identified. Electronically Signed   By: Marcello Moores  Register   On: 03/10/2019 11:47    Procedures Procedures (including critical care time)  Medications Ordered in ED Medications  ondansetron (ZOFRAN) injection 4 mg (4 mg Intravenous Given 03/10/19 1112)  HYDROmorphone (DILAUDID) injection 1 mg (1 mg Intravenous Given 03/10/19 1111)  sodium chloride 0.9 % bolus 1,000 mL (0 mLs Intravenous Stopped 03/10/19 1203)  ceFAZolin (ANCEF) IVPB 1 g/50 mL premix (0 g Intravenous Stopped 03/10/19 1204)     Initial Impression / Assessment and Plan / ED Course  I have reviewed the triage vital signs and the nursing notes.  Pertinent labs & imaging results that were available during my care of the patient were reviewed by me and considered in my medical decision making (see chart for details).        Update: On repeat exam patient is in similar condition. Reviewed the x-ray findings with him and his wife. No evidence for fracture. But with concern for tendon disruption, he has received antibiotics, is awaiting orthopedic evaluation.   1:26 PM Patient has been seen and evaluated by our surgical colleagues, patient is awaiting OR  for repair of extensor tendon laceration. Covid test pending. Final Clinical Impressions(s) / ED Diagnoses   Final diagnoses:  Laceration of tendon of left hand  Laceration of left thumb without foreign body without damage to nail, initial encounter     Carmin Muskrat, MD 03/10/19 1327

## 2019-03-10 NOTE — Anesthesia Preprocedure Evaluation (Addendum)
Anesthesia Evaluation  Patient identified by MRN, date of birth, ID band Patient awake    Reviewed: Allergy & Precautions, NPO status , Patient's Chart, lab work & pertinent test results  Airway Mallampati: II  TM Distance: >3 FB Neck ROM: Full    Dental  (+) Dental Advisory Given   Pulmonary neg pulmonary ROS, Current Smoker,    Pulmonary exam normal breath sounds clear to auscultation       Cardiovascular negative cardio ROS Normal cardiovascular exam Rhythm:Regular Rate:Normal     Neuro/Psych negative neurological ROS  negative psych ROS   GI/Hepatic negative GI ROS, Neg liver ROS,   Endo/Other  negative endocrine ROS  Renal/GU negative Renal ROS     Musculoskeletal negative musculoskeletal ROS (+)   Abdominal   Peds  Hematology negative hematology ROS (+)   Anesthesia Other Findings   Reproductive/Obstetrics negative OB ROS                             Anesthesia Physical Anesthesia Plan  ASA: II  Anesthesia Plan: MAC   Post-op Pain Management:    Induction: Intravenous  PONV Risk Score and Plan: 1 and Ondansetron, Dexamethasone, Propofol infusion and Treatment may vary due to age or medical condition  Airway Management Planned:   Additional Equipment:   Intra-op Plan:   Post-operative Plan:   Informed Consent: I have reviewed the patients History and Physical, chart, labs and discussed the procedure including the risks, benefits and alternatives for the proposed anesthesia with the patient or authorized representative who has indicated his/her understanding and acceptance.     Dental advisory given  Plan Discussed with: CRNA  Anesthesia Plan Comments:         Anesthesia Quick Evaluation

## 2019-03-10 NOTE — Discharge Instructions (Signed)
Discharge Instructions   You have a dressing with a plaster splint incorporated in it. Move your fingers as much as possible, making a full fist and fully opening the fist. Elevate your hand to reduce pain & swelling of the digits.  Ice over the operative site may be helpful to reduce pain & swelling.  DO NOT USE HEAT. Pain medicine has been prescribed for you.  Take Ibuprofen 600 mg and Tylenol 650 mg every 6 hours together. Take Oxycodone 5 mg additionally as a rescue medicine for severe post operative pain. Leave the dressing in place until you return to our office.  You may shower, but keep the bandage clean & dry.  You may drive a car when you are off of prescription pain medications and can safely control your vehicle with both hands. Our office will call you to arrange follow-up   Please call [phone] during normal business hours or [phone] after hours for any problems. Including the following:  - excessive redness of the incisions - drainage for more than 4 days - fever of more than 101.5 F  *Please note that pain medications will not be refilled after hours or on weekends.  WORK STATUS: No work with the left hand.

## 2019-03-10 NOTE — ED Triage Notes (Addendum)
Pt states he was cutting a limb with a chainsaw, the chainsaw bucked back and cut across left thumb and hand. Bleeding controlled with pressure dressing.

## 2019-03-10 NOTE — Op Note (Signed)
03/10/2019  3:05 PM  PATIENT:  Jacob Galloway  48 y.o. male  PRE-OPERATIVE DIAGNOSIS:  L thumb laceration with suspected extensor tendon laceration  POST-OPERATIVE DIAGNOSIS:  Same, with confirmed EPL & EPB & SRN lacerations  PROCEDURE:   1. Left thumb wound exploration, with excisional debridement of skin, SQ, and tendon    2. Left thumb EPL repair    3. Left thumb EPB repair    4. Left thumb SRN repair    5. Left thumb traumatic wound complex closure, 11 cm total   SURGEON: Marquitta Persichetti A. Grandville Silos, MD  PHYSICIAN ASSISTANT: Morley Kos, OPA  ANESTHESIA:  local and MAC  SPECIMENS:  None  DRAINS:   None  EBL:  less than 50 mL  PREOPERATIVE INDICATIONS:  Jacob Galloway is a  48 y.o. male with left thumb chainsaw injury, with an extensor tendon injury  The risks benefits and alternatives were discussed with the patient preoperatively including but not limited to the risks of infection, bleeding, nerve injury, cardiopulmonary complications, the need for revision surgery, among others, and the patient verbalized understanding and consented to proceed.  OPERATIVE IMPLANTS: none  OPERATIVE PROCEDURE:  After receiving prophylactic antibiotics, the patient was escorted to the operative theatre and placed in a supine position. Local anesthetic was infiltrated subcutaneously, as well as proximally as a SRN and palmar cutaneous branch median nerve block..   A surgical "time-out" was performed during which the planned procedure, proposed operative site, and the correct patient identity were compared to the operative consent and agreement confirmed by the circulating nurse according to current facility policy.  Following application and inflation of a tourniquet to the operative extremity, the exposed skin was pre-scrubbed with a Betadine scrub brush before being formally prepped with Betadine and draped in the usual sterile fashion.    The mostly transverse traumatic wound was  extended to create a T-shaped wound, I longitudinally incising the skin on the ulnar side of the traumatic wound.  Flaps were elevated.  There was jagged irregular skin edges, some of which was not viable, which was excisionally debrided with forceps and scissors.  What remained still had some shredding of the skin, with the major transverse wound, and a couple of parallel but shorter in length wounds line proximal to it, creating 3 rows of traumatic wounds.  If already been excised, there was insufficient skin and subcutaneous tissues are primary coverage.  In addition, appeared to be viable shredded and so it was incorporated into the repair, constituting the complexity of the repair.  Once the flaps were elevated, the deeper damage was surveyed.  There was no injury to the first metacarpal, but periosteum was stripped at the distal aspect.  EPB and EPL were divided and retracted.  These were found proximally, advanced to the wound and excisionally debrided at the ends to freshen them.  Both tendons were repaired with 3-0 FiberWire suture in a modified Kessler technique, constituting a 2 strand repair with additional locked running epitendinous Prolene suture for the EPL.  After repairing both tendons, the terminal branch of the sRNA on the radial aspect of the digit was reapproximated with 8-0 nylon interrupted epineurial sutures after debriding the nerve back to healthy fascicles on both sides.  The nerve repair was not under excessive tension.  The alignment and posture of the thumb was much improved, and the tenodesis effect restored.  Attention was shifted to closure.  The tourniquet was released, some additional hemostasis obtained with bipolar electrocautery  and the surgically created incision was closed with interrupted 4-0 Vicryl Rapide sutures.  The traumatic wounds were also reapproximated with similar suture type, constituting 11 cm in total, given the triple levels of injury.  A short arm thumb  spica splint dressing was applied with the wrist extended and the thumb extended and he was taken to recovery room in stable condition.  DISPOSITION: He will be discharged home today with appropriate analgesic plan.  We will seek to arrange follow-up with him at [facility] therapy in 5 to 10 days, at which time a forearm-based thumb spica splint can be fabricated, with the wrist in extension and thumb extended and begin rehab for his EPL injury at the level of the thumb metacarpal.  He will return to see me in 10 to 15 days, for inspection of the splint, review of his rehab protocol and wound check.

## 2019-03-10 NOTE — Consult Note (Addendum)
Reason for Consult:Left hand injury Referring Physician: R Ritter Galloway is an 48 y.o. male.  HPI: Jacob Galloway was working with a chainsaw today when it cut his left hand over his thumb. He came to the ED for evaluation. Exam by the EDP revealed a likely extensor tendon injury and hand surgery was consulted. He is RHD.  History reviewed. No pertinent past medical history.  Past Surgical History:  Procedure Laterality Date  . ABDOMINAL SURGERY    . COLON SURGERY    . HERNIA REPAIR      History reviewed. No pertinent family history.  Social History:  reports that he has been smoking cigarettes. He has been smoking about 1.00 pack per day. He has never used smokeless tobacco. He reports current alcohol use. He reports current drug use. Drug: Marijuana.  Allergies:  Allergies  Allergen Reactions  . Codeine Swelling  . Phenobarbital Other (See Comments)    unknown    Medications: I have reviewed the patient's current medications.  No results found for this or any previous visit (from the past 48 hour(s)).  Dg Hand 2 View Left  Result Date: 03/10/2019 CLINICAL DATA:  Chainsaw injury. EXAM: LEFT HAND - 2 VIEW COMPARISON:  No recent. FINDINGS: Patient is in a bandage and the left thumb is flexed make evaluation difficult. No acute bony or joint abnormality identified. Tiny sclerotic density noted the distal left radius most likely tiny bone island. No radiopaque foreign body. IMPRESSION: No acute abnormality identified. Electronically Signed   By: Jacob Moores  Galloway   On: 03/10/2019 11:47    Review of Systems  Constitutional: Negative for weight loss.  HENT: Negative for ear discharge, ear pain, hearing loss and tinnitus.   Eyes: Negative for blurred vision, double vision, photophobia and pain.  Respiratory: Negative for cough, sputum production and shortness of breath.   Cardiovascular: Negative for chest pain.  Gastrointestinal: Negative for abdominal pain, nausea and  vomiting.  Genitourinary: Negative for dysuria, flank pain, frequency and urgency.  Musculoskeletal: Positive for joint pain (Left thumb). Negative for back pain, falls, myalgias and neck pain.  Neurological: Negative for dizziness, tingling, sensory change, focal weakness, loss of consciousness and headaches.  Endo/Heme/Allergies: Does not bruise/bleed easily.  Psychiatric/Behavioral: Negative for depression, memory loss and substance abuse. The patient is not nervous/anxious.    Blood pressure 126/82, pulse 72, temperature 98.4 F (36.9 C), temperature source Oral, resp. rate 16, height 6\' 2"  (1.88 m), weight 63.5 kg, SpO2 99 %. Physical Exam  Constitutional: He appears well-developed and well-nourished. No distress.  HENT:  Head: Normocephalic and atraumatic.  Eyes: Conjunctivae are normal. Right eye exhibits no discharge. Left eye exhibits no discharge. No scleral icterus.  Neck: Normal range of motion.  Cardiovascular: Normal rate and regular rhythm.  Respiratory: Effort normal. No respiratory distress.  Musculoskeletal:     Comments: Left shoulder, elbow, wrist, digits- Transverse lac over dorsal base of thumb, mod TTP, unable to extend MCP, IP joints, no instability, mild paresthesias radial/ulnar tip of thumb, =  Sens  Ax/Jacob/M/U intact  Mot   Ax/ Jacob/ PIN/ M/ AIN/ U intact  Rad 2+  Neurological: He is alert.  Skin: Skin is warm and dry. He is not diaphoretic.  Psychiatric: He has a normal mood and affect. His behavior is normal.      Assessment/Plan: Left thumb lac -- Plan for OR this afternoon by Dr. Grandville Galloway. NPO until then. Anticipate discharge after surgery.    Jacob Abu, PA-C  Orthopedic Surgery [phone] 03/10/2019, 12:44 PM   Acute L thumb dorsal chainsaw lac at mid-MC level, with no active MP/IP extension, but retained 1st MC extension.  No apparent skeletal injury.  Tetanus UTD.  COVID testing not returned. Will proceed with operative exploration and  repair of injured structures (anticipate extensor tendon(s) and skin) once OR available and COVID testing allows.  G/Jacob/O reviewed and consent obtained.

## 2019-03-10 NOTE — Anesthesia Procedure Notes (Signed)
Date/Time: 03/10/2019 3:24 PM Performed by: Glory Buff, CRNA Oxygen Delivery Method: Simple face mask

## 2019-03-11 ENCOUNTER — Encounter (HOSPITAL_COMMUNITY): Payer: Self-pay | Admitting: Orthopedic Surgery

## 2019-03-11 NOTE — Anesthesia Postprocedure Evaluation (Signed)
Anesthesia Post Note  Patient: Jacob Galloway  Procedure(s) Performed: IRRIGATION AND DEBRIDEMENT EXTREMITY REPAIR OF SKIN, TWO TENSONS, AND NERVE REPAIR (Left Hand)     Patient location during evaluation: PACU Anesthesia Type: MAC Level of consciousness: awake and alert Pain management: pain level controlled Vital Signs Assessment: post-procedure vital signs reviewed and stable Respiratory status: spontaneous breathing Cardiovascular status: stable Anesthetic complications: no    Last Vitals:  Vitals:   03/10/19 1700 03/10/19 1715  BP: (!) 138/95 (!) 135/97  Pulse: 73 79  Resp: 16 16  Temp: 36.6 C 36.6 C  SpO2: 100% 100%    Last Pain:  Vitals:   03/10/19 1715  TempSrc:   PainSc: Brilliant

## 2019-03-12 ENCOUNTER — Other Ambulatory Visit: Payer: Self-pay

## 2019-03-12 ENCOUNTER — Emergency Department (HOSPITAL_COMMUNITY)
Admission: EM | Admit: 2019-03-12 | Discharge: 2019-03-12 | Disposition: A | Discharge status: Home / Self Care | Payer: Self-pay | Attending: Emergency Medicine | Admitting: Emergency Medicine

## 2019-03-12 ENCOUNTER — Encounter (HOSPITAL_COMMUNITY): Payer: Self-pay | Admitting: Emergency Medicine

## 2019-03-12 DIAGNOSIS — F17210 Nicotine dependence, cigarettes, uncomplicated: Secondary | ICD-10-CM | POA: Insufficient documentation

## 2019-03-12 DIAGNOSIS — X58XXXD Exposure to other specified factors, subsequent encounter: Secondary | ICD-10-CM | POA: Insufficient documentation

## 2019-03-12 DIAGNOSIS — S61412D Laceration without foreign body of left hand, subsequent encounter: Secondary | ICD-10-CM | POA: Insufficient documentation

## 2019-03-12 DIAGNOSIS — Z4801 Encounter for change or removal of surgical wound dressing: Secondary | ICD-10-CM

## 2019-03-12 NOTE — Discharge Instructions (Addendum)
Keep the dressing clean and dry.  Follow-up with your surgeon next week as scheduled.

## 2019-03-12 NOTE — ED Triage Notes (Signed)
Pt seen at [facility] yesterday and place din cast.  Here to have cast placed and dressing re-wrapped.

## 2019-03-16 NOTE — ED Provider Notes (Signed)
[facility] EMERGENCY DEPARTMENT Provider Note   CSN: [id number] Arrival date & time: 03/12/19  5093     History   Chief Complaint Chief Complaint  Patient presents with  . Dressing Change    HPI Jacob Galloway is a 48 y.o. male.     HPI   Jacob Galloway is a 48 y.o. male who presents to the Emergency Department requesting a dressing change of his left hand.  He was seen at [facility] two days prior and treated for a laceration and tendon injury of his hand.  He states that he accidentally got his cast wet last evening and didn't know how to re wrap his hand.  Denies increasing pain, fever, swelling of the hand.      History reviewed. No pertinent past medical history.  There are no active problems to display for this patient.   Past Surgical History:  Procedure Laterality Date  . ABDOMINAL SURGERY    . COLON SURGERY    . HERNIA REPAIR    . I&D EXTREMITY Left 03/10/2019   Procedure: IRRIGATION AND DEBRIDEMENT EXTREMITY REPAIR OF SKIN, TWO TENSONS, AND NERVE REPAIR;  Surgeon: Milly Jakob, MD;  Location: [facility] ORS;  Service: Orthopedics;  Laterality: Left;        Home Medications    Prior to Admission medications   Medication Sig Start Date End Date Taking? Authorizing Provider  acetaminophen (TYLENOL) 325 MG tablet Take 2 tablets (650 mg total) by mouth every 6 (six) hours. 03/10/19   Milly Jakob, MD  ibuprofen (ADVIL) 600 MG tablet Take 1 tablet (600 mg total) by mouth every 6 (six) hours. 03/10/19   Milly Jakob, MD  oxyCODONE (ROXICODONE) 5 MG immediate release tablet Take 1 tablet (5 mg total) by mouth every 6 (six) hours as needed for severe pain (severe postop pain). 03/10/19   Milly Jakob, MD    Family History History reviewed. No pertinent family history.  Social History Social History   Tobacco Use  . Smoking status: Current Every Day Smoker    Packs/day: 1.00    Types: Cigarettes  . Smokeless tobacco: Never Used  Substance  Use Topics  . Alcohol use: Yes  . Drug use: Yes    Types: Marijuana     Allergies   Codeine and Phenobarbital   Review of Systems Review of Systems  Constitutional: Negative for chills and fever.  Musculoskeletal: Negative for arthralgias and myalgias.  Skin: Negative for color change and rash.  Neurological: Negative for weakness, numbness and headaches.  Hematological: Does not bruise/bleed easily.     Physical Exam Updated Vital Signs BP (!) 141/86 (BP Location: Right Arm)   Pulse 86   Temp 98.2 F (36.8 C) (Oral)   Resp 18   Ht 6\' 2"  (1.88 m)   Wt 63.5 kg   SpO2 98%   BMI 17.97 kg/m   Physical Exam Vitals signs and nursing note reviewed.  Constitutional:      Appearance: Normal appearance. He is not ill-appearing or toxic-appearing.  HENT:     Head: Atraumatic.  Cardiovascular:     Rate and Rhythm: Normal rate and regular rhythm.     Pulses: Normal pulses.  Pulmonary:     Effort: Pulmonary effort is normal.     Breath sounds: Normal breath sounds.  Musculoskeletal:        General: Tenderness present.     Comments: Laceration of the proximal left thumb with sutures in place.  No surrounding erythema  or drainage.  No active bleeding.  Pt has ROM of the distal thumb.  Sensation intact of the distal tip and ulnar aspect of the thumb.  Wrist is non-tender  Skin:    General: Skin is warm.     Findings: No erythema.  Neurological:     Mental Status: He is alert.      ED Treatments / Results  Labs (all labs ordered are listed, but only abnormal results are displayed) Labs Reviewed - No data to display  EKG None  Radiology No results found.  Procedures Procedures (including critical care time)  Medications Ordered in ED Medications - No data to display   Initial Impression / Assessment and Plan / ED Course  I have reviewed the triage vital signs and the nursing notes.  Pertinent labs & imaging results that were available during my care of the  patient were reviewed by me and considered in my medical decision making (see chart for details).        Pt has his surgically placed splint with him.  Wound re bandaged and splint reapplied.  Remains NV intact.  Pt agrees to f/u with his surgeon   Final Clinical Impressions(s) / ED Diagnoses   Final diagnoses:  Dressing change or removal, surgical wound    ED Discharge Orders    None       Rosey Bath 03/16/19 2150    Raeford Razor, MD 03/24/19 [phone]

## 2019-03-18 ENCOUNTER — Ambulatory Visit (HOSPITAL_COMMUNITY): Payer: Self-pay | Attending: Orthopedic Surgery | Admitting: Specialist

## 2019-03-18 ENCOUNTER — Other Ambulatory Visit: Payer: Self-pay

## 2019-03-18 ENCOUNTER — Ambulatory Visit: Payer: Self-pay | Admitting: Occupational Therapy

## 2019-03-18 ENCOUNTER — Encounter (HOSPITAL_COMMUNITY): Payer: Self-pay | Admitting: Specialist

## 2019-03-18 DIAGNOSIS — M25642 Stiffness of left hand, not elsewhere classified: Secondary | ICD-10-CM | POA: Insufficient documentation

## 2019-03-18 DIAGNOSIS — R278 Other lack of coordination: Secondary | ICD-10-CM | POA: Insufficient documentation

## 2019-03-18 DIAGNOSIS — R600 Localized edema: Secondary | ICD-10-CM | POA: Insufficient documentation

## 2019-03-18 NOTE — Therapy (Signed)
[facility] [address] [place], [place], [address] Phone: [phone]   Fax:  [phone]  Occupational Therapy Evaluation  Patient Details  Name: Jacob MarinerChristopher B Digilio MRN: [id number] Date of Birth: Oct 21, 1970 Referring Provider (OT): Dr. Mack Hookavid Thompson   Encounter Date: 03/18/2019  OT End of Session - 03/18/19 2142    Visit Number  1    Number of Visits  8    Date for OT Re-Evaluation  05/12/19   mini reassess on 04/08/19   Authorization Type  self pay    OT Start Time  1610    OT Stop Time  1700    OT Time Calculation (min)  50 min    Activity Tolerance  Patient tolerated treatment well    Behavior During Therapy  [facility] for tasks assessed/performed       History reviewed. No pertinent past medical history.  Past Surgical History:  Procedure Laterality Date  . ABDOMINAL SURGERY    . COLON SURGERY    . HERNIA REPAIR    . I&D EXTREMITY Left 03/10/2019   Procedure: IRRIGATION AND DEBRIDEMENT EXTREMITY REPAIR OF SKIN, TWO TENSONS, AND NERVE REPAIR;  Surgeon: Mack Hookhompson, David, MD;  Location: [facility];  Service: Orthopedics;  Laterality: Left;    There were no vitals filed for this visit.  Subjective Assessment - 03/18/19 2134    Subjective   S:  I have tried making it move and it won't.  It is numb.    Pertinent History  Mr. Renee HarderMedley was utilizing a chainsaw on 03/10/19.  The chainsaw hit a powerline and then bounced and lacerated his left thumb, lacerating the FPL, FPB,SRN. He underwent surgery to repair the FPL, FPB, and SRN on 03/10/19. He was referred to occupational therapy for evaluation and treatment, including splinting of the left thumb.    Patient Stated Goals  I want to get back to work.  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    Currently in Pain?  Yes    Pain Score  7     Pain Location  Hand   thumb   Pain Orientation  Left    Pain Descriptors / Indicators  Tingling;Burning;Sore    Pain Type  Acute pain    Pain  Radiating Towards  thumb    Pain Onset  In the past 7 days    Pain Frequency  Constant    Aggravating Factors   movement        [facility] OT Assessment - 03/18/19 0001      Assessment   Medical Diagnosis  S/P Laceration of Left Thumb FPL, FPB, SRN    Referring Provider (OT)  Dr. Mack Hookavid Thompson    Onset Date/Surgical Date  03/10/19    Hand Dominance  Right      Precautions   Precautions  None      Restrictions   Weight Bearing Restrictions  No      Balance Screen   Has the patient fallen in the past 6 months  No    Has the patient had a decrease in activity level because of a fear of falling?   No    Is the patient reluctant to leave their home because of a fear of falling?   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  scant drainage from incision       Sensation   Additional Comments  patient reports numbness in left thumb       Coordination   Other  complete nine hole peg test at nest session.  unable to use thumb to oppose digits, pick up items      Edema   Edema  thumb IPJ:  right:  3.0" left:  3.5"      ROM / Strength   AROM / PROM / Strength  AROM;PROM;Strength      AROM   Overall AROM Comments  0 a/rom in left thumb      PROM   Overall PROM Comments  10 degrees of flexion at MCPJ and IPJ of left  thumb      Strength   Overall Strength  --   not assessed due to recent surgery     Left Hand AROM   L Thumb MCP 0-60  --   formal assessment at next visit   L Thumb IP 0-80  --   formal assessment at next visit              OT Treatments/Exercises (OP) - 03/18/19 0001      Splinting   Splinting  fabricated a volar forearm based thumb spica splint, with wrist in 15 degrees extension, thumb midway between palmar and radial abduction, and IPJ in extension.  MCPJ and IPJ fixed, Paitent educated on donning and doffing of splint, wear at all times except for hygiene, and contraindications of splint use.  Paitent demonstrated independence donning and doffing of splint.  placed gauze on surgical incision and secored with tape.              OT Education - 03/18/19 2140    Education Details  reviewed splint wear and care instructions:  wear at all times, removing for hygine only.    Person(s) Educated  Patient    Methods  Explanation;Demonstration    Comprehension  Verbalized understanding       OT Short Term Goals - 03/18/19 2150      OT SHORT TERM GOAL #1   Title  Patient will be educated on HEP for safe wear and care of left thumb spica splint.    Time  4    Period  Weeks    Status  New    Target Date  04/15/19      OT SHORT TERM GOAL #2   Title  Patient will be educated and independent with a/rom exericises following extensor tendon protocol.    Time  4    Period  Weeks    Status  New        OT Long Term Goals - 03/18/19 2151      OT LONG TERM GOAL #1  Title  Patient will be able to use his left hand WFL with all desired B/IADLs, work, and leisure activities.    Time  8    Period  Weeks    Status  New    Target Date  05/13/19      OT LONG TERM GOAL #2   Title  Patient will have WFL A/ROM and P/ROM in his left thumb in order to manipulate household and work objects.    Time  4    Period  Weeks    Status  New      OT LONG TERM GOAL #3   Title   Patient will improve left hand fine motor coordination to WNL in order to pick up items at work using pincer grasp.    Time  8    Period  Weeks    Status  New      OT LONG TERM GOAL #4   Title  Patient will improve left hand grip and pinch strength to WNL in order to maintain grasp on tools at work.    Time  8    Period  Weeks    Status  New      OT LONG TERM GOAL #5   Title  Patient will decrease pain in his left hand and thumb to 3/10 or better for improved functional use of left hand during daily tasks.    Time  8    Period  Weeks    Status  New      Long Term Additional Goals   Additional Long Term Goals  Yes      OT LONG TERM GOAL #6   Title  Patient will improve sensation in his left thumb to [facility] for improved safety during functional work tasks.    Time  8    Period  Weeks    Status  New      OT LONG TERM GOAL #7   Title  Patient will decrease edema in left thumb by 2.5 cm or better for improved comfort and less pain during functional tasks.    Time  8    Period  Weeks    Status  New            Plan - 03/18/19 2143    Clinical Impression Statement  A:  Patient is a 48 year old male without significant medical history.  Patient lacerated his left thumb FPL, FPB, and SRN on 03/10/19 with a chainsaw. Patient presented to ED and underwent surgery on 03/10/19 to repair his lacerated tendons and nerve.  He was casted and refered to occupational therapy for evaluation and treatment for fabrication of thumb spica splint, and therapy following extensor protocol.  Patient is unable to use his left hand with daily tasks, unable to work, unable to complete leisure activities.    OT Occupational Profile and History  Problem Focused Assessment - Including review of records relating to presenting problem    Occupational performance deficits (Please refer to evaluation for details):  ADL's;IADL's;Rest and Sleep;Work;Leisure    Body Structure / Function / Physical Skills  ADL;GMC;Scar  mobility;UE functional use;Sensation;Pain;Skin integrity;FMC;Dexterity;Strength;Wound;Edema;ROM    Rehab Potential  Good    Clinical Decision Making  Limited treatment options, no task modification necessary    Comorbidities Affecting Occupational Performance:  None    Modification or Assistance to Complete Evaluation   No modification of tasks or assist necessary to complete eval    OT Frequency  1x /  week   hold sessions until 10/19 to update protocol   OT Duration  8 weeks    OT Treatment/Interventions  Self-care/ADL training;Therapeutic exercise;Patient/family education;Splinting;Moist Heat;Scar mobilization;Passive range of motion;Manual Therapy;Ultrasound;Cryotherapy;Contrast Bath;DME and/or AE instruction    Plan  P:  Patient will benefit from skilled OT intervention to improve sensation, mobility, strength, coordination, and decrease pain and sensation in left thumb region.  Hold sessions until 4 week post op to update protocol.  Modify splint as needed for comfort.    OT Home Exercise Plan  splint wear and care    Consulted and Agree with Plan of Care  Patient       Patient will benefit from skilled therapeutic intervention in order to improve the following deficits and impairments:   Body Structure / Function / Physical Skills: ADL, GMC, Scar mobility, UE functional use, Sensation, Pain, Skin integrity, FMC, Dexterity, Strength, Wound, Edema, ROM       Visit Diagnosis: Other lack of coordination - Plan: Ot plan of care cert/re-cert  Stiffness of left hand, not elsewhere classified - Plan: Ot plan of care cert/re-cert  Localized edema - Plan: Ot plan of care cert/re-cert    Problem List There are no active problems to display for this patient.   Shirlean Mylar, MHA, OTR/L [phone]  03/18/2019, 10:11 PM  [facility] [address] [place], [place], [address] Phone: [phone]   Fax:  [phone]  Name: JOHNSON ARIZOLA MRN: [id number] Date of Birth: November 29, 1970

## 2019-04-07 ENCOUNTER — Ambulatory Visit (HOSPITAL_COMMUNITY): Payer: Self-pay | Attending: Orthopedic Surgery | Admitting: Specialist

## 2020-05-13 IMAGING — DX DG HAND 2V*L*
2 series · 2 of 2 positions shown · non-contrast
Comparison: No recent.

CLINICAL DATA: Chainsaw injury.

EXAM:
LEFT HAND - 2 VIEW

[hand ap]
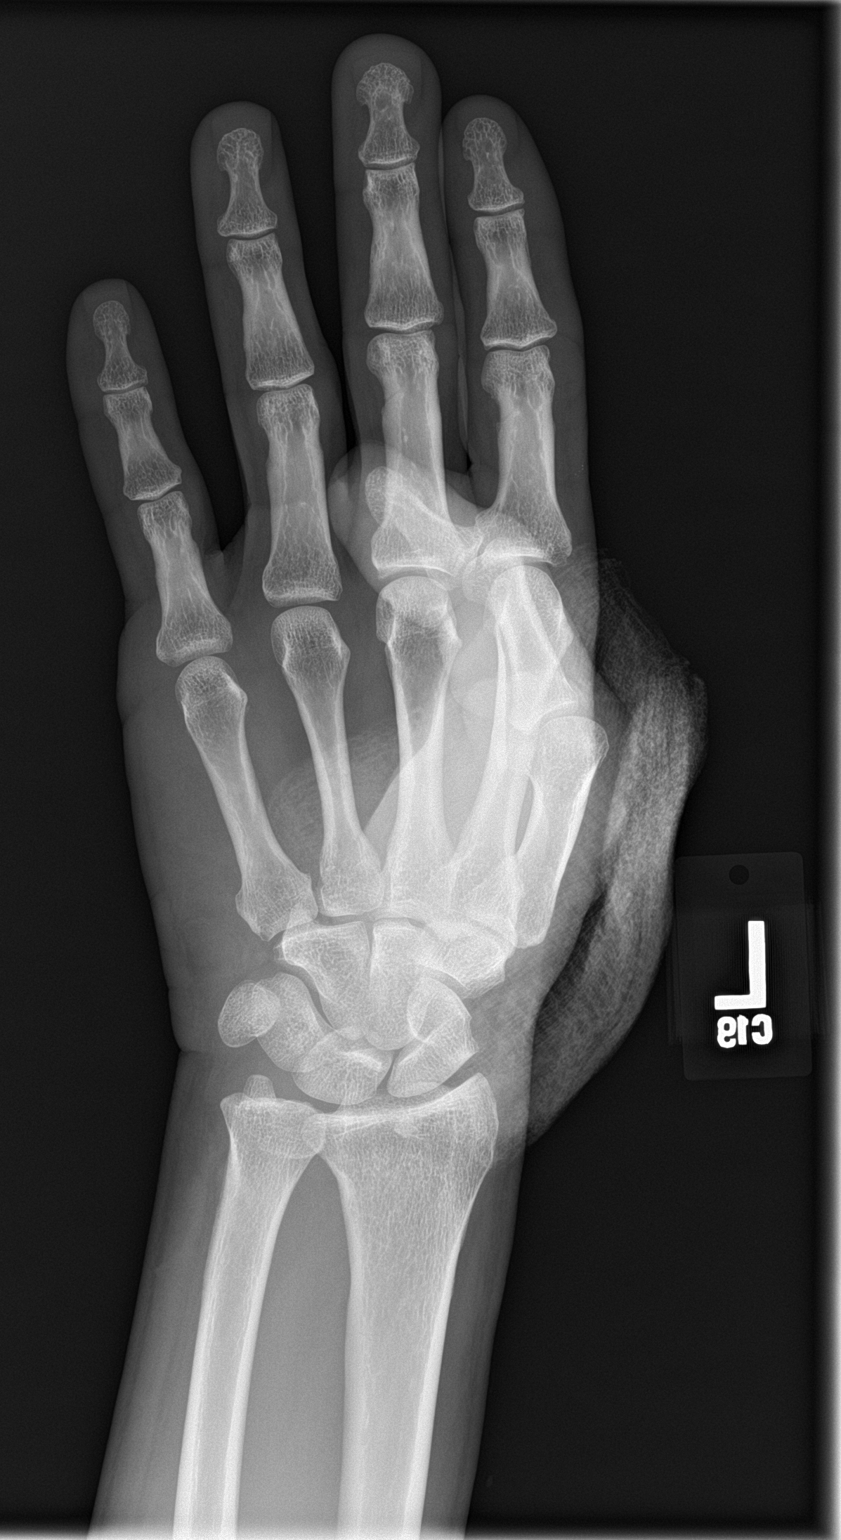

[hand lat]
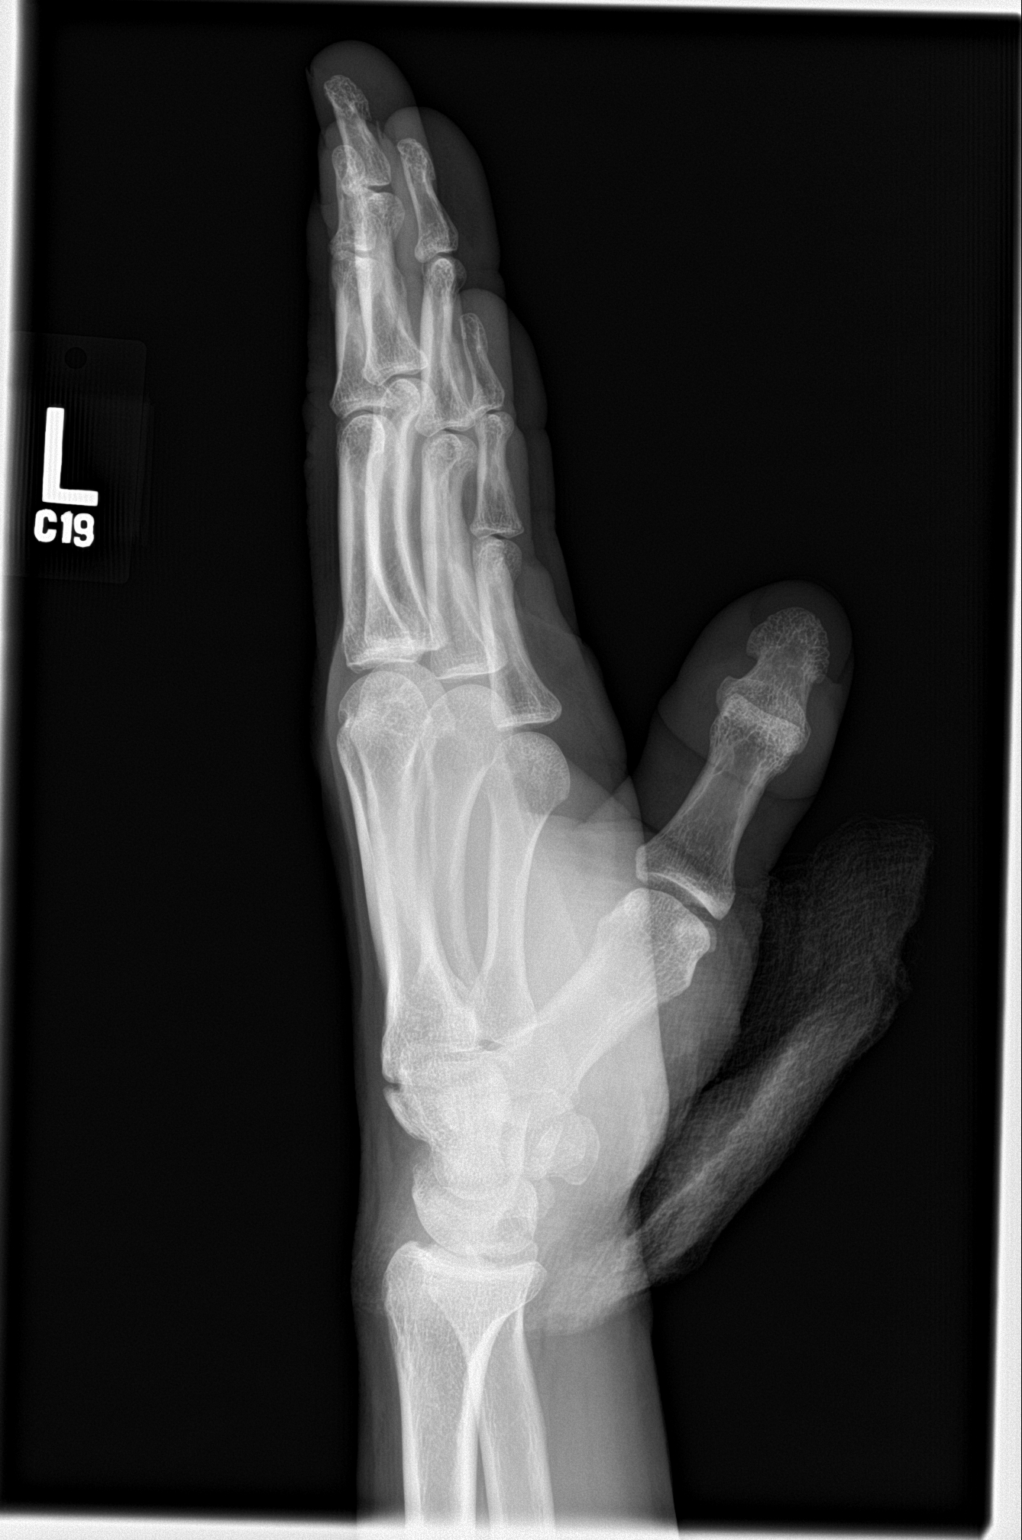

[2 of 2 positions shown; findings below may reference images not displayed]

FINDINGS: Patient is in a bandage and the left thumb is flexed make evaluation
difficult. No acute bony or joint abnormality identified. Tiny
sclerotic density noted the distal left radius most likely tiny bone
island. No radiopaque foreign body.
IMPRESSION: No acute abnormality identified.
